# Patient Record
Sex: Male | Born: 1935 | Race: White | Hispanic: No | Smoking: Former smoker
Health system: Southern US, Community
[De-identification: ages and names within clinical notes are randomized; demographics above are authoritative.]

## PROBLEM LIST (undated history)

## (undated) DIAGNOSIS — J449 Chronic obstructive pulmonary disease, unspecified: Secondary | ICD-10-CM

## (undated) DIAGNOSIS — C189 Malignant neoplasm of colon, unspecified: Secondary | ICD-10-CM

## (undated) HISTORY — PX: OTHER SURGICAL HISTORY: SHX169

---

## 1998-02-15 ENCOUNTER — Encounter: Admission: RE | Admit: 1998-02-15 | Discharge: 1998-05-16 | Payer: Self-pay | Admitting: Orthopaedic Surgery

## 2004-04-25 ENCOUNTER — Ambulatory Visit (HOSPITAL_BASED_OUTPATIENT_CLINIC_OR_DEPARTMENT_OTHER): Admission: RE | Admit: 2004-04-25 | Discharge: 2004-04-25 | Payer: Self-pay | Admitting: Orthopedic Surgery

## 2004-04-25 ENCOUNTER — Ambulatory Visit (HOSPITAL_COMMUNITY): Admission: RE | Admit: 2004-04-25 | Discharge: 2004-04-25 | Payer: Self-pay | Admitting: Orthopedic Surgery

## 2006-10-18 ENCOUNTER — Inpatient Hospital Stay (HOSPITAL_COMMUNITY): Admission: EM | Admit: 2006-10-18 | Discharge: 2006-10-23 | Payer: Self-pay | Admitting: Emergency Medicine

## 2008-08-10 ENCOUNTER — Ambulatory Visit (HOSPITAL_COMMUNITY): Admission: RE | Admit: 2008-08-10 | Discharge: 2008-08-10 | Payer: Self-pay | Admitting: Ophthalmology

## 2008-08-31 ENCOUNTER — Ambulatory Visit (HOSPITAL_COMMUNITY): Admission: RE | Admit: 2008-08-31 | Discharge: 2008-08-31 | Payer: Self-pay | Admitting: Ophthalmology

## 2010-04-08 ENCOUNTER — Encounter: Admission: RE | Admit: 2010-04-08 | Discharge: 2010-04-08 | Payer: Self-pay | Admitting: Gastroenterology

## 2011-04-04 NOTE — Op Note (Signed)
NAME:  [name], [name]                          ACCOUNT NO.:  [id number]   MEDICAL RECORD NO.:  [id number]                   PATIENT TYPE:  AMB   LOCATION:  DSC                                  FACILITY:  [facility]   PHYSICIAN:  Katy Fitch. Naaman Plummer., M.D.          DATE OF BIRTH:  05-10-1936   DATE OF PROCEDURE:  04/25/2004  DATE OF DISCHARGE:                                 OPERATIVE REPORT   PREOPERATIVE DIAGNOSES:  Comminuted fracture of right thumb P1 shaft.   POSTOPERATIVE DIAGNOSES:  Comminuted fracture of right thumb P1 shaft.   OPERATION:  Open reduction and internal fixation of a comminuted right thumb  P1 fracture utilizing a 1.3 mm eight hole ASIF modular plate.   SURGEON:  Katy Fitch. Sypher, M.D.   ASSISTANT:  Jonni Sanger, P.A.   ANESTHESIA:  Axillary block supplemented by IV sedation.  Supervision  anesthesiologist is Bedelia Person, M.D.   INDICATIONS FOR PROCEDURE:  [name] is a 75 year old man who was  bailing hay earlier this week.  He accidentally crushed his right dominant  thumb sustaining a comminuted fracture.   He was seen at [facility] by Marjory Lies, M.D. who  obtained x-rays documenting a comminuted shaft fracture of the right thumb.  He was referred for a hand surgery consult.   Clinical examination revealed a swollen and angulated right thumb. There was  noted to be a comminuted fracture with a transverse component and a split of  the distal half of the phalanx.   We recommended open reduction and internal fixation with plate fixation to  facilitate early range of motion exercises.   After informed consent Mr. Daubert is brought to the operating room at this  time.   DESCRIPTION OF PROCEDURE:  Quaron Delacruz is brought to the operating room and  placed in supine position upon the operating table. Following placement of  an axillary block in the holding area, anesthesia was satisfactory in the  right upper extremity.   The arm  was prepped with Betadine __________ solution and sterilely draped.  A pneumatic tourniquet was applied to the proximal brachium.   Following exsanguination of the right arm with an esmarch bandage, the  arterial tourniquet was inflated to 220 mmHg.   The procedure commenced with a curvilinear incision exposing the extensor  mechanism over P1.  The subcutaneous tissues were carefully divided taking  care to identify the longitudinal veins and electrocauterize the transverse  veins.  The extensor tendon was split from the base of the proximal phalanx  to the IP joint distally. A periosteal elevator was used to gently elevate  the periosteum revealing the comminuted fracture site.   The fracture was reduced by a combination of longitudinal traction and  slight three point bending. This was reduced anatomically along the dorsal,  radial and ulnar cortices.   An eight hole titanium ASIF plate was selected and placed with  appropriate  length screws.  The C-arm fluoroscope was used after each screw placement to  determine screw depth and to assure maintenance with reduction.   There was some comminution of a small fragment that was slightly impacted.  In my judgment, this did not require open reduction from a volar approach. A  very satisfactory construct was achieved. Range of motion of the MP and IP  joint was full after placement of the complimented eight screws.   The wound was lavaged with sterile saline followed by repair of the  periosteum with a running suture of 4-0 Vicryl and repair of the extensor  mechanism with a running suture of 4-0 Mersilene. The skin was repaired with  intradermal 3-0 Prolene. A compression dressing applied with a thumb spica  splint.   There were no apparent complications.                                               Katy Fitch Naaman Plummer., M.D.    RVS/MEDQ  D:  04/25/2004  T:  04/26/2004  Job:  [id number]
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
## 2011-04-04 NOTE — Discharge Summary (Signed)
NAMEOSHAY, STRANAHAN NO.:  [id number]   MEDICAL RECORD NO.:  [id number]          PATIENT TYPE:  INP   LOCATION:  1610                         [facility]AC[facility]   PHYSICIAN:  Lonia Blood, M.D.      DATE OF BIRTH:  05-11-1936   DATE OF ADMISSION:  10/18/2006  DATE OF DISCHARGE:  10/23/2006                               DISCHARGE SUMMARY   PRIMARY CARE PHYSICIAN:  [facility].   DISCHARGE DIAGNOSES:  1. Acute urinary retention, most likely due to prostate enlargement.  2. Severe ileus.  3. Depression/anxiety.  4. Insomnia.  5. Dyslipidemia.   DISCHARGE MEDICATIONS:  1. Flomax 0.4 mg daily.  2. Lipitor 10 mg daily.  3. Remeron 30 mg daily.  4. Xanax 0.5 to 1 mg p.o. b.i.d. and 1-2 mg at night.   DISPOSITION:  Patient is being discharged home.  His symptoms have  subsided.  His ileus has resolved.  He will follow up with Dr. Laverle Patter,  urologist, in 1-2 weeks for further outpatient care.   Procedures performed this admission include abdominal x-ray on December  2nd that showed no acute abdominal abnormalities, hyperinflation  consistent with COPD/asthma, but no acute cardiopulmonary disease.   Another x-ray on December 3 shows no change in the mild diffuse ileus.   Another x-ray on December 4th showed findings most consistent with a  nonobstructive ileus.   Abdominal x-ray on December 5th showed improvement in ileus.   CONSULTATIONS:  Urology, Dr. Patsi Sears.  Later, Dr. Heloise Purpura.   BRIEF HISTORY AND PHYSICAL:  Please refer to extensive history and  physical by Dr. Lonia Blood.  In short, however, this is a 75-  year-old gentleman with known history of prostatic hypertrophy  presenting with acute urinary and bowel retention.  Patient was admitted  for workup of his urinary retention and further management.   HOSPITAL COURSE:  1. Acute urinary retention:  Patient had apparently discontinued      Flomax as an outpatient.  He  was restarted on Flomax while in the      hospital, and urology was consulted.  They placed, patient was on      Foley catheter for the past two days. Subsequently, a voiding trial      was performed after discontinuing his Foley catheter.  Patient was      able to void without difficulty since then.  Per urology,      therefore, he will be followed up as an outpatient.  Part of the      workup was ruling out prostate cancer.  His prostate specific      antigen, PSA, was elevated mildly at 4.23.  This was, however,      agreed to be followed as an outpatient by Dr. Laverle Patter.   1. Ileus:  Patient apparently had an abdominal ileus.  There is      reported prior history of surgery, but it was nonobstructive.  We      followed him serially with abdominal x-rays while keeping him on      bowel rest.  His ileus has since resolved.  He has been having      bowel movements, and patient is eating with advancement of diet to      regular today.   1. Tobacco abuse:  He was counseled and given a nicotine patch while      in the hospital.   1. Occasional confusion:  Patient has had a few episodes of confusion      at night, but mainly, he has been insomnic.  That has been promptly      corrected.  Otherwise, all other problems are stable, and the      patient is ready for discharge home.  He is going to continue his      antianxiety and antidepressant medications.      Lonia Blood, M.D.  Electronically Signed     LG/MEDQ  D:  10/23/2006  T:  10/23/2006  Job:  [id number]

## 2011-08-18 LAB — BASIC METABOLIC PANEL
CO2: 27
Chloride: 105
GFR calc Af Amer: >60
Potassium: 4

## 2011-08-18 LAB — HEMOGLOBIN AND HEMATOCRIT, BLOOD: HCT: 42.3

## 2013-12-27 ENCOUNTER — Emergency Department (HOSPITAL_COMMUNITY): Payer: Medicare Other

## 2013-12-27 ENCOUNTER — Encounter (HOSPITAL_COMMUNITY): Payer: Self-pay | Admitting: Emergency Medicine

## 2013-12-27 ENCOUNTER — Inpatient Hospital Stay (HOSPITAL_COMMUNITY)
Admission: EM | Admit: 2013-12-27 | Discharge: 2013-12-29 | DRG: 190 | Discharge status: Against Medical Advice | Payer: Medicare Other | Attending: Internal Medicine | Admitting: Internal Medicine

## 2013-12-27 DIAGNOSIS — E874 Mixed disorder of acid-base balance: Secondary | ICD-10-CM | POA: Diagnosis present

## 2013-12-27 DIAGNOSIS — Z85038 Personal history of other malignant neoplasm of large intestine: Secondary | ICD-10-CM

## 2013-12-27 DIAGNOSIS — E43 Unspecified severe protein-calorie malnutrition: Secondary | ICD-10-CM | POA: Diagnosis present

## 2013-12-27 DIAGNOSIS — J441 Chronic obstructive pulmonary disease with (acute) exacerbation: Principal | ICD-10-CM | POA: Diagnosis present

## 2013-12-27 DIAGNOSIS — C189 Malignant neoplasm of colon, unspecified: Secondary | ICD-10-CM | POA: Insufficient documentation

## 2013-12-27 DIAGNOSIS — J9600 Acute respiratory failure, unspecified whether with hypoxia or hypercapnia: Secondary | ICD-10-CM | POA: Diagnosis present

## 2013-12-27 DIAGNOSIS — K5900 Constipation, unspecified: Secondary | ICD-10-CM | POA: Diagnosis present

## 2013-12-27 DIAGNOSIS — Z681 Body mass index (BMI) 19 or less, adult: Secondary | ICD-10-CM

## 2013-12-27 DIAGNOSIS — R000 Tachycardia, unspecified: Secondary | ICD-10-CM | POA: Diagnosis not present

## 2013-12-27 DIAGNOSIS — Z9889 Other specified postprocedural states: Secondary | ICD-10-CM

## 2013-12-27 DIAGNOSIS — Z79899 Other long term (current) drug therapy: Secondary | ICD-10-CM

## 2013-12-27 DIAGNOSIS — R1310 Dysphagia, unspecified: Secondary | ICD-10-CM

## 2013-12-27 DIAGNOSIS — R490 Dysphonia: Secondary | ICD-10-CM | POA: Diagnosis present

## 2013-12-27 DIAGNOSIS — W3400XA Accidental discharge from unspecified firearms or gun, initial encounter: Secondary | ICD-10-CM

## 2013-12-27 DIAGNOSIS — Z87891 Personal history of nicotine dependence: Secondary | ICD-10-CM

## 2013-12-27 HISTORY — DX: Malignant neoplasm of colon, unspecified: C18.9

## 2013-12-27 HISTORY — DX: Chronic obstructive pulmonary disease, unspecified: J44.9

## 2013-12-27 LAB — CBC
HEMATOCRIT: 42.1 % (ref 39.0–52.0)
HEMOGLOBIN: 13.7 g/dL (ref 13.0–17.0)
MCH: 29.7 pg (ref 26.0–34.0)
MCHC: 32.5 g/dL (ref 30.0–36.0)
MCV: 91.3 fL (ref 78.0–100.0)
Platelets: 195 10*3/uL (ref 150–400)
RBC: 4.61 MIL/uL (ref 4.22–5.81)
RDW: 14 % (ref 11.5–15.5)
WBC: 5.7 10*3/uL (ref 4.0–10.5)

## 2013-12-27 LAB — BLOOD GAS, ARTERIAL
ACID-BASE EXCESS: 7.6 mmol/L — AB (ref 0.0–2.0)
BICARBONATE: 33.5 meq/L — AB (ref 20.0–24.0)
Drawn by: 347621
FIO2: 0.21 %
O2 SAT: 84.4 %
PATIENT TEMPERATURE: 98.6
PH ART: 7.407 (ref 7.350–7.450)
TCO2: 29.9 mmol/L (ref 0–100)
pCO2 arterial: 54.4 mmHg — ABNORMAL HIGH (ref 35.0–45.0)
pO2, Arterial: 46.3 mmHg — ABNORMAL LOW (ref 80.0–100.0)

## 2013-12-27 LAB — EXPECTORATED SPUTUM ASSESSMENT W REFEX TO RESP CULTURE

## 2013-12-27 LAB — BASIC METABOLIC PANEL
BUN: 16 mg/dL (ref 6–23)
CALCIUM: 8.7 mg/dL (ref 8.4–10.5)
CO2: 28 mEq/L (ref 19–32)
CREATININE: 0.75 mg/dL (ref 0.50–1.35)
Chloride: 99 mEq/L (ref 96–112)
GFR calc Af Amer: >90 mL/min (ref >90)
GFR, EST NON AFRICAN AMERICAN: 88 mL/min — AB (ref >90)
Glucose, Bld: 111 mg/dL — ABNORMAL HIGH (ref 70–99)
Potassium: 3.7 mEq/L (ref 3.7–5.3)
SODIUM: 140 meq/L (ref 137–147)

## 2013-12-27 LAB — EXPECTORATED SPUTUM ASSESSMENT W GRAM STAIN, RFLX TO RESP C

## 2013-12-27 LAB — TROPONIN I: Troponin I: <0.3 ng/mL (ref <0.30)

## 2013-12-27 LAB — INFLUENZA PANEL BY PCR (TYPE A & B)
H1N1FLUPCR: NOT DETECTED
INFLBPCR: NEGATIVE
Influenza A By PCR: NEGATIVE

## 2013-12-27 LAB — MRSA PCR SCREENING: MRSA by PCR: NEGATIVE

## 2013-12-27 MED ORDER — OSELTAMIVIR PHOSPHATE 75 MG PO CAPS
75.0000 mg | ORAL_CAPSULE | Freq: Two times a day (BID) | ORAL | Status: DC
  Filled 2013-12-27 (×2): qty 1

## 2013-12-27 MED ORDER — BIOTENE DRY MOUTH MT LIQD
15.0000 mL | Freq: Two times a day (BID) | OROMUCOSAL | Status: DC
Start: 2013-12-27 — End: 2013-12-29
  Administered 2013-12-27 – 2013-12-28 (×3): 15 mL via OROMUCOSAL

## 2013-12-27 MED ORDER — ALBUTEROL SULFATE (2.5 MG/3ML) 0.083% IN NEBU
5.0000 mg | INHALATION_SOLUTION | Freq: Once | RESPIRATORY_TRACT | Status: AC
  Administered 2013-12-27: 5 mg via RESPIRATORY_TRACT
  Filled 2013-12-27: qty 6

## 2013-12-27 MED ORDER — TAMSULOSIN HCL 0.4 MG PO CAPS
0.8000 mg | ORAL_CAPSULE | Freq: Every day | ORAL | Status: DC
Start: 2013-12-27 — End: 2013-12-29
  Administered 2013-12-27 – 2013-12-28 (×2): 0.8 mg via ORAL
  Filled 2013-12-27 (×3): qty 2

## 2013-12-27 MED ORDER — ONDANSETRON HCL 4 MG/2ML IJ SOLN
4.0000 mg | Freq: Four times a day (QID) | INTRAMUSCULAR | Status: DC | PRN
  Administered 2013-12-29: 4 mg via INTRAVENOUS
  Filled 2013-12-27: qty 2

## 2013-12-27 MED ORDER — FLUTICASONE PROPIONATE 50 MCG/ACT NA SUSP
2.0000 | Freq: Every day | NASAL | Status: DC
  Administered 2013-12-29: 2 via NASAL
  Filled 2013-12-27 (×2): qty 16

## 2013-12-27 MED ORDER — MIRTAZAPINE 15 MG PO TABS
15.0000 mg | ORAL_TABLET | Freq: Every day | ORAL | Status: DC
  Administered 2013-12-27 – 2013-12-28 (×2): 15 mg via ORAL
  Filled 2013-12-27 (×3): qty 1

## 2013-12-27 MED ORDER — SODIUM CHLORIDE 0.9 % IV SOLN
INTRAVENOUS | Status: AC
  Administered 2013-12-27 (×2): via INTRAVENOUS

## 2013-12-27 MED ORDER — ALBUTEROL SULFATE (2.5 MG/3ML) 0.083% IN NEBU: 2.5000 mg | INHALATION_SOLUTION | RESPIRATORY_TRACT | Status: DC

## 2013-12-27 MED ORDER — ONDANSETRON HCL 4 MG PO TABS: 4.0000 mg | ORAL_TABLET | Freq: Four times a day (QID) | ORAL | Status: DC | PRN

## 2013-12-27 MED ORDER — LACTULOSE 10 GM/15ML PO SOLN
10.0000 g | Freq: Two times a day (BID) | ORAL | Status: DC | PRN
  Administered 2013-12-28: 10 g via ORAL
  Filled 2013-12-27 (×2): qty 15

## 2013-12-27 MED ORDER — METHYLPREDNISOLONE SODIUM SUCC 125 MG IJ SOLR
125.0000 mg | Freq: Once | INTRAMUSCULAR | Status: AC
  Administered 2013-12-27: 125 mg via INTRAVENOUS
  Filled 2013-12-27: qty 2

## 2013-12-27 MED ORDER — DEXTROSE 5 % IV SOLN
500.0000 mg | INTRAVENOUS | Status: DC
  Administered 2013-12-28: 500 mg via INTRAVENOUS
  Filled 2013-12-27: qty 500

## 2013-12-27 MED ORDER — MESALAMINE 1000 MG RE SUPP
1000.0000 mg | Freq: Every day | RECTAL | Status: DC
  Administered 2013-12-27 – 2013-12-28 (×2): 1000 mg via RECTAL
  Filled 2013-12-27 (×3): qty 1

## 2013-12-27 MED ORDER — SODIUM CHLORIDE 0.9 % IV SOLN: INTRAVENOUS | Status: DC

## 2013-12-27 MED ORDER — MESALAMINE 1000 MG RE SUPP
1000.0000 mg | Freq: Every day | RECTAL | Status: DC
  Filled 2013-12-27: qty 1

## 2013-12-27 MED ORDER — DEXTROSE 5 % IV SOLN
1.0000 g | INTRAVENOUS | Status: DC
  Administered 2013-12-28: 1 g via INTRAVENOUS
  Filled 2013-12-27: qty 10

## 2013-12-27 MED ORDER — IPRATROPIUM-ALBUTEROL 0.5-2.5 (3) MG/3ML IN SOLN
3.0000 mL | RESPIRATORY_TRACT | Status: DC
  Administered 2013-12-27 (×2): 3 mL via RESPIRATORY_TRACT
  Filled 2013-12-27 (×2): qty 3

## 2013-12-27 MED ORDER — IPRATROPIUM-ALBUTEROL 0.5-2.5 (3) MG/3ML IN SOLN
3.0000 mL | Freq: Four times a day (QID) | RESPIRATORY_TRACT | Status: DC
  Administered 2013-12-27 – 2013-12-29 (×6): 3 mL via RESPIRATORY_TRACT
  Filled 2013-12-27 (×6): qty 3

## 2013-12-27 MED ORDER — ALPRAZOLAM 0.5 MG PO TABS
0.5000 mg | ORAL_TABLET | Freq: Two times a day (BID) | ORAL | Status: DC | PRN
  Administered 2013-12-27 – 2013-12-29 (×3): 0.5 mg via ORAL
  Filled 2013-12-27 (×4): qty 1

## 2013-12-27 MED ORDER — IPRATROPIUM BROMIDE 0.02 % IN SOLN: 0.5000 mg | RESPIRATORY_TRACT | Status: DC

## 2013-12-27 MED ORDER — LUBIPROSTONE 24 MCG PO CAPS
24.0000 ug | ORAL_CAPSULE | Freq: Two times a day (BID) | ORAL | Status: DC
  Administered 2013-12-28 – 2013-12-29 (×2): 24 ug via ORAL
  Filled 2013-12-27 (×6): qty 1

## 2013-12-27 MED ORDER — ATORVASTATIN CALCIUM 20 MG PO TABS
20.0000 mg | ORAL_TABLET | Freq: Every day | ORAL | Status: DC
  Administered 2013-12-28 – 2013-12-29 (×2): 20 mg via ORAL
  Filled 2013-12-27 (×3): qty 1

## 2013-12-27 MED ORDER — HEPARIN SODIUM (PORCINE) 5000 UNIT/ML IJ SOLN
5000.0000 [IU] | Freq: Three times a day (TID) | INTRAMUSCULAR | Status: DC
  Administered 2013-12-27 – 2013-12-29 (×6): 5000 [IU] via SUBCUTANEOUS
  Filled 2013-12-27 (×10): qty 1

## 2013-12-27 MED ORDER — LACTULOSE 10 GM/15ML PO SOLN
10.0000 g | Freq: Two times a day (BID) | ORAL | Status: DC | PRN
  Filled 2013-12-27: qty 15

## 2013-12-27 MED ORDER — SODIUM CHLORIDE 0.9 % IJ SOLN
3.0000 mL | Freq: Two times a day (BID) | INTRAMUSCULAR | Status: DC
  Administered 2013-12-27 – 2013-12-29 (×2): 3 mL via INTRAVENOUS

## 2013-12-27 MED ORDER — ALPRAZOLAM 1 MG PO TABS
1.0000 mg | ORAL_TABLET | Freq: Once | ORAL | Status: AC
  Administered 2013-12-27: 1 mg via ORAL
  Filled 2013-12-27: qty 1

## 2013-12-27 MED ORDER — ALBUTEROL SULFATE (2.5 MG/3ML) 0.083% IN NEBU: 2.5000 mg | INHALATION_SOLUTION | RESPIRATORY_TRACT | Status: DC | PRN

## 2013-12-27 MED ORDER — ALBUTEROL (5 MG/ML) CONTINUOUS INHALATION SOLN
10.0000 mg/h | INHALATION_SOLUTION | RESPIRATORY_TRACT | Status: DC
  Administered 2013-12-27: 10 mg/h via RESPIRATORY_TRACT
  Filled 2013-12-27: qty 20

## 2013-12-27 MED ORDER — METHYLPREDNISOLONE SODIUM SUCC 40 MG IJ SOLR
40.0000 mg | Freq: Four times a day (QID) | INTRAMUSCULAR | Status: DC
  Administered 2013-12-27 – 2013-12-29 (×8): 40 mg via INTRAVENOUS
  Filled 2013-12-27 (×13): qty 1

## 2013-12-27 MED ORDER — GUAIFENESIN ER 600 MG PO TB12
600.0000 mg | ORAL_TABLET | Freq: Two times a day (BID) | ORAL | Status: DC
  Administered 2013-12-27 – 2013-12-29 (×4): 600 mg via ORAL
  Filled 2013-12-27 (×6): qty 1

## 2013-12-27 NOTE — ED Notes (Signed)
Respiratory at bedside.
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
## 2013-12-27 NOTE — ED Notes (Addendum)
Pt from home via [facility] c/o shortness of breath starting today. Wheezes in all fields and diminished. Hoarness in voice he reports has been there 2 years. HX of COPD and Colon CA. Pt gets extremely short of breath on exertion and uses accessory muscles. Pt has a RX for Prednisone but has not filled it and he ran out of his neb treatments today. Pt is a poor historian.
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
## 2013-12-27 NOTE — ED Notes (Signed)
Pt's O2 saturation 84-86% on room air and while at rest.

## 2013-12-27 NOTE — ED Notes (Signed)
Bed: TM21 Expected date:  Expected time:  Means of arrival:  Comments: EMS/SOB/colon cancer pt

## 2013-12-27 NOTE — ED Notes (Signed)
Pt's O2 saturation remained at 86% on room air while at rest.
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
## 2013-12-28 DIAGNOSIS — E43 Unspecified severe protein-calorie malnutrition: Secondary | ICD-10-CM | POA: Insufficient documentation

## 2013-12-28 DIAGNOSIS — R1310 Dysphagia, unspecified: Secondary | ICD-10-CM

## 2013-12-28 LAB — GLUCOSE, CAPILLARY: Glucose-Capillary: 181 mg/dL — ABNORMAL HIGH (ref 70–99)

## 2013-12-28 MED ORDER — LEVOFLOXACIN 500 MG PO TABS
500.0000 mg | ORAL_TABLET | Freq: Every day | ORAL | Status: DC
  Administered 2013-12-28 – 2013-12-29 (×2): 500 mg via ORAL
  Filled 2013-12-28 (×2): qty 1

## 2013-12-28 MED ORDER — SODIUM CHLORIDE 0.9 % IV SOLN: INTRAVENOUS | Status: DC

## 2013-12-28 MED ORDER — MOMETASONE FURO-FORMOTEROL FUM 100-5 MCG/ACT IN AERO
2.0000 | INHALATION_SPRAY | Freq: Two times a day (BID) | RESPIRATORY_TRACT | Status: DC
  Administered 2013-12-28 – 2013-12-29 (×2): 2 via RESPIRATORY_TRACT
  Filled 2013-12-28: qty 8.8

## 2013-12-28 NOTE — Progress Notes (Signed)
SLP Cancellation Note  Patient Details Name: [name] MRN: [id number] DOB: [birth year]   Cancelled treatment:       Reason Eval/Treat Not Completed: Other (comment) (pt lethargy, RN reports pt had received remeron earlier and has been lethargic, she does report tolerance of po last evening)   Luanna Salk, [facility] SLP [phone]
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
## 2013-12-29 LAB — BASIC METABOLIC PANEL
BUN: 29 mg/dL — AB (ref 6–23)
CHLORIDE: 100 meq/L (ref 96–112)
CO2: 32 meq/L (ref 19–32)
CREATININE: 0.78 mg/dL (ref 0.50–1.35)
Calcium: 9 mg/dL (ref 8.4–10.5)
GFR calc Af Amer: >90 mL/min (ref >90)
GFR calc non Af Amer: 86 mL/min — ABNORMAL LOW (ref >90)
Glucose, Bld: 159 mg/dL — ABNORMAL HIGH (ref 70–99)
Potassium: 3.8 mEq/L (ref 3.7–5.3)
Sodium: 142 mEq/L (ref 137–147)

## 2013-12-29 LAB — CBC
HCT: 42 % (ref 39.0–52.0)
HEMOGLOBIN: 13.4 g/dL (ref 13.0–17.0)
MCH: 29.3 pg (ref 26.0–34.0)
MCHC: 31.9 g/dL (ref 30.0–36.0)
MCV: 91.7 fL (ref 78.0–100.0)
Platelets: 213 10*3/uL (ref 150–400)
RBC: 4.58 MIL/uL (ref 4.22–5.81)
RDW: 14.6 % (ref 11.5–15.5)
WBC: 16.3 10*3/uL — ABNORMAL HIGH (ref 4.0–10.5)

## 2013-12-29 MED ORDER — PREDNISONE 20 MG PO TABS: ORAL_TABLET | ORAL | Status: DC

## 2013-12-29 MED ORDER — ALBUTEROL SULFATE (2.5 MG/3ML) 0.083% IN NEBU: 2.5000 mg | INHALATION_SOLUTION | Freq: Four times a day (QID) | RESPIRATORY_TRACT | Status: AC | PRN

## 2013-12-29 MED ORDER — GUAIFENESIN ER 600 MG PO TB12: 600.0000 mg | ORAL_TABLET | Freq: Two times a day (BID) | ORAL | Status: DC

## 2013-12-29 MED ORDER — IPRATROPIUM-ALBUTEROL 0.5-2.5 (3) MG/3ML IN SOLN
3.0000 mL | Freq: Four times a day (QID) | RESPIRATORY_TRACT | Status: DC
  Administered 2013-12-29 (×2): 3 mL via RESPIRATORY_TRACT
  Filled 2013-12-29 (×3): qty 3

## 2013-12-29 MED ORDER — LEVOFLOXACIN 500 MG PO TABS: 500.0000 mg | ORAL_TABLET | Freq: Every day | ORAL | Status: DC

## 2013-12-29 NOTE — Care Management Note (Signed)
    Page 1 of 1   12/29/2013     3:00:32 PM   CARE MANAGEMENT NOTE 12/29/2013  Patient:  [name], [name]   Account Number:  [id number]  Date Initiated:  12/28/2013  Documentation initiated by:  [facility]  Subjective/Objective Assessment:   78 year old male admitted with COPD exacerbation.     Action/Plan:   From home.   Anticipated DC Date:  12/31/2013   Anticipated DC Plan:  [place]  CM consult      Choice offered to / List presented to:             Status of service:  Completed, signed off Medicare Important Message given?  NA - LOS <3 / Initial given by admissions (If response is "NO", the following Medicare IM given date fields will be blank) Date Medicare IM given:   Date Additional Medicare IM given:    Discharge Disposition:  [place]  Per UR Regulation:  Reviewed for med. necessity/level of care/duration of stay  If discussed at [place] of Stay Meetings, dates discussed:    Comments:

## 2013-12-29 NOTE — Progress Notes (Signed)
Assessment unchanged. Sats in upper 90's following neb tx. No complaints voiced. Pt still wants to go home. Dr. Maryland Pink called room and spoke with pt. Encouraged pt to stay per Dr. Lyman Speller recommendations yet pt adamant to go home. Pt signed AMA papers. Dr. Maryland Pink notified and okayed for pt to have scripts for home with encouragement to follow up with PCP, Dr. Redmond Pulling, as soon as possible. Pt discharged via wc to front entrance accompanied by NT and son.

## 2013-12-29 NOTE — Evaluation (Signed)
Physical Therapy Evaluation Patient Details Name: [name] MRN: [id number] DOB: [birth year] Today's Date: 12/29/2013 Time: 4825-0037 PT Time Calculation (min): 24 min  PT Assessment / Plan / Recommendation History of Present Illness  [name] is a 78 y.o. male with Past medical history of colon cancer, urinary retention, former smoker and COPD admitted with shortness of breath  Clinical Impression  Pt appears at baseline level of functioning, no further acute PT needs identified at this time.    PT Assessment  Patent does not need any further PT services    Follow Up Recommendations  No PT follow up    Does the patient have the potential to tolerate intense rehabilitation      Barriers to Discharge        Equipment Recommendations  None recommended by PT    Recommendations for Other Services     Frequency      Precautions / Restrictions Precautions Precautions: Fall Restrictions Weight Bearing Restrictions: No   Pertinent Vitals/Pain No c/o pain      Mobility  Bed Mobility Overal bed mobility: Modified Independent Transfers Overall transfer level: Modified independent Ambulation/Gait Ambulation/Gait assistance: Modified independent (Device/Increase time) Ambulation Distance (Feet): 150 Feet Assistive device: None Gait velocity interpretation: Below normal speed for age/gender General Gait Details: pt requires frequent rest breaks standing to catch breath, spO2 91% on room air during activity    Exercises     PT Diagnosis:    PT Problem List:   PT Treatment Interventions:       PT Goals(Current goals can be found in the care plan section) Acute Rehab PT Goals PT Goal Formulation: No goals set, d/c therapy  Visit Information  Last PT Received On: 12/29/13 Assistance Needed: +1 History of Present Illness: [name] is a 78 y.o. male with Past medical history of colon cancer, urinary retention, former smoker and COPD admitted with  shortness of breath       Prior Functioning  Home Living Family/patient expects to be discharged to:: Private residence Living Arrangements: Alone Available Help at Discharge: Family;Available PRN/intermittently Type of Home: House Home Access: Level entry Home Layout: One level Home Equipment: None Prior Function Level of Independence: Independent Communication Communication:  (voice is hoarse, increased SOB with talking)    Cognition  Cognition Arousal/Alertness: Awake/alert Behavior During Therapy: WFL for tasks assessed/performed Overall Cognitive Status: Within Functional Limits for tasks assessed    Extremity/Trunk Assessment Lower Extremity Assessment Lower Extremity Assessment: Generalized weakness Cervical / Trunk Assessment Cervical / Trunk Assessment: Normal   Balance Balance Overall balance assessment: Modified Independent General Comments General comments (skin integrity, edema, etc.): pt able to stand and perform grooming tasks without UE support without LOB  End of Session PT - End of Session Activity Tolerance: Patient tolerated treatment well Patient left: in bed;with call bell/phone within reach;with bed alarm set Nurse Communication: Mobility status  GP     Lucianna Ostlund 12/29/2013, 9:49 AM

## 2013-12-29 NOTE — Progress Notes (Signed)
Dr. Maryland Pink aware via phone pt's HR remains elevated as well as BP. Current sat 97% on RA. BP 138/119 (dinamapp) and 136/92 (manually) with current HR 117. Pt asymptomatic with no complaints voiced. Stated "I just want to go home." Resting in recliner with feet elevated. MD ask nurse to call RT for breathing treatment. RT notified per MD request.

## 2013-12-29 NOTE — Progress Notes (Signed)
Dr. Maryland Pink aware via phone RT in to see pt. Pt up getting ready. O2 sat 85-86% on RA while up in room with fluctuating HR 120's-130's with occational spikes. Tachypnea noted while up. Sitting in chair at present per encouragement of RT and nurse. Tachypnea improving and sats 91-93% on RA yet HR remains high. See new orders enter by MD.

## 2013-12-29 NOTE — Progress Notes (Addendum)
Called by Rn that his saturations were 86% with activity. Patient did not complain of feeling more short of breath. At rest the sats came upto to 92%. His HR was also high at 130. Asked Rn to keep checking HR and allow it to come down.  This was surprising given sats were ok when he was ambulated by PT/OT.  In view of this will hold discharge for now. Will continue one more day of inpatient treatment. Will also order home O2.  [name] 1:03 PM   Called by RN that patient wanted to go home. Patient continued to be tachycardic. His saturations had improved. I spoke to the patient and I recommended that he stay for at least one more day so that we can continue with his inpatient treatment. However, patient was adamant that he wanted to go home. I tried to tell him that it was more important that he stay in the hospital to take care of his health. However, patient refused my recommendations. I explained to him that if he goes home and remains short of breath with low oxygen levels, he could get worse, get more short of breath, could pass out and could even die. Patient understood these risks. And he left AGAINST MEDICAL ADVICE.  [name] 5:12 PM

## 2013-12-29 NOTE — Evaluation (Signed)
Occupational Therapy Evaluation Patient Details Name: [name] MRN: [id number] DOB: [birth year] Today's Date: 12/29/2013 Time: 9407-6808 OT Time Calculation (min): 12 min  OT Assessment / Plan / Recommendation History of present illness [name] is a 78 y.o. male with Past medical history of colon cancer, urinary retention, former smoker and COPD admitted with shortness of breath   Clinical Impression   Pt I with Simple ADL activity    OT Assessment  Patient does not need any further OT services    Follow Up Recommendations  No OT follow up       Equipment Recommendations  None recommended by OT          Precautions / Restrictions Precautions Precautions: Fall Restrictions Weight Bearing Restrictions: No       ADL  Transfers/Ambulation Related to ADLs: Pt is I with ADL activity. Family helps pt as he needs.  Pt hard to understand with his voice but it I with simple ADL activity. no further OT needed.        Visit Information  Last OT Received On: 12/29/13 Assistance Needed: +1 History of Present Illness: [name] is a 78 y.o. male with Past medical history of colon cancer, urinary retention, former smoker and COPD admitted with shortness of breath       Prior [place] expects to be discharged to:: Private residence Living Arrangements: Alone Available Help at Discharge: Family;Available PRN/intermittently Type of Home: House Home Access: Level entry Home Layout: One level Home Equipment: None Prior Function Level of Independence: Independent Communication Communication:  (voice is hoarse, increased SOB with talking)         Vision/Perception Vision - History Baseline Vision: Wears glasses for distance only   Cognition  Cognition Arousal/Alertness: Awake/alert Behavior During Therapy: WFL for tasks assessed/performed Overall Cognitive Status: Within Functional Limits for tasks assessed     Extremity/Trunk Assessment Upper Extremity Assessment Upper Extremity Assessment: Generalized weakness Lower Extremity Assessment Lower Extremity Assessment: Generalized weakness Cervical / Trunk Assessment Cervical / Trunk Assessment: Normal     Mobility Bed Mobility Overal bed mobility: Modified Independent Transfers Overall transfer level: Modified independent        Balance Balance Overall balance assessment: Modified Independent General Comments General comments (skin integrity, edema, etc.): pt able to stand and perform grooming tasks without UE support without LOB   End of Session OT - End of Session Activity Tolerance: Patient tolerated treatment well Patient left: in bed Nurse Communication: Mobility status  GO     Betsy Pries 12/29/2013, 11:52 AM
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
## 2013-12-30 LAB — CULTURE, RESPIRATORY W GRAM STAIN

## 2013-12-30 LAB — CULTURE, RESPIRATORY: Culture: NORMAL

## 2014-01-14 ENCOUNTER — Inpatient Hospital Stay (HOSPITAL_COMMUNITY): Payer: Medicare Other

## 2014-01-14 ENCOUNTER — Inpatient Hospital Stay (HOSPITAL_COMMUNITY)
Admission: EM | Admit: 2014-01-14 | Discharge: 2014-02-15 | DRG: 208 | Disposition: E | Payer: Medicare Other | Attending: Pulmonary Disease | Admitting: Pulmonary Disease

## 2014-01-14 ENCOUNTER — Emergency Department (HOSPITAL_COMMUNITY): Payer: Medicare Other

## 2014-01-14 DIAGNOSIS — R64 Cachexia: Secondary | ICD-10-CM | POA: Diagnosis present

## 2014-01-14 DIAGNOSIS — J9600 Acute respiratory failure, unspecified whether with hypoxia or hypercapnia: Principal | ICD-10-CM | POA: Diagnosis present

## 2014-01-14 DIAGNOSIS — I10 Essential (primary) hypertension: Secondary | ICD-10-CM | POA: Diagnosis present

## 2014-01-14 DIAGNOSIS — J189 Pneumonia, unspecified organism: Secondary | ICD-10-CM | POA: Diagnosis present

## 2014-01-14 DIAGNOSIS — Z87891 Personal history of nicotine dependence: Secondary | ICD-10-CM

## 2014-01-14 DIAGNOSIS — Z79899 Other long term (current) drug therapy: Secondary | ICD-10-CM

## 2014-01-14 DIAGNOSIS — Z681 Body mass index (BMI) 19 or less, adult: Secondary | ICD-10-CM

## 2014-01-14 DIAGNOSIS — J398 Other specified diseases of upper respiratory tract: Secondary | ICD-10-CM

## 2014-01-14 DIAGNOSIS — Z66 Do not resuscitate: Secondary | ICD-10-CM | POA: Diagnosis present

## 2014-01-14 DIAGNOSIS — J441 Chronic obstructive pulmonary disease with (acute) exacerbation: Secondary | ICD-10-CM | POA: Diagnosis present

## 2014-01-14 DIAGNOSIS — E43 Unspecified severe protein-calorie malnutrition: Secondary | ICD-10-CM | POA: Diagnosis present

## 2014-01-14 DIAGNOSIS — R222 Localized swelling, mass and lump, trunk: Secondary | ICD-10-CM | POA: Diagnosis present

## 2014-01-14 DIAGNOSIS — C189 Malignant neoplasm of colon, unspecified: Secondary | ICD-10-CM

## 2014-01-14 DIAGNOSIS — Z515 Encounter for palliative care: Secondary | ICD-10-CM

## 2014-01-14 LAB — CBC WITH DIFFERENTIAL/PLATELET
Basophils Absolute: 0 10*3/uL (ref 0.0–0.1)
Basophils Relative: 0 % (ref 0–1)
Eosinophils Absolute: 0 10*3/uL (ref 0.0–0.7)
Eosinophils Relative: 0 % (ref 0–5)
HCT: 41.2 % (ref 39.0–52.0)
HEMOGLOBIN: 13.3 g/dL (ref 13.0–17.0)
LYMPHS ABS: 1.2 10*3/uL (ref 0.7–4.0)
Lymphocytes Relative: 7 % — ABNORMAL LOW (ref 12–46)
MCH: 30.2 pg (ref 26.0–34.0)
MCHC: 32.3 g/dL (ref 30.0–36.0)
MCV: 93.4 fL (ref 78.0–100.0)
MONO ABS: 0.5 10*3/uL (ref 0.1–1.0)
MONOS PCT: 3 % (ref 3–12)
NEUTROS ABS: 14.1 10*3/uL — AB (ref 1.7–7.7)
NEUTROS PCT: 89 % — AB (ref 43–77)
Platelets: 195 10*3/uL (ref 150–400)
RBC: 4.41 MIL/uL (ref 4.22–5.81)
RDW: 15.4 % (ref 11.5–15.5)
WBC: 15.8 10*3/uL — ABNORMAL HIGH (ref 4.0–10.5)

## 2014-01-14 LAB — BASIC METABOLIC PANEL
BUN: 30 mg/dL — ABNORMAL HIGH (ref 6–23)
BUN: 30 mg/dL — ABNORMAL HIGH (ref 6–23)
CALCIUM: 8 mg/dL — AB (ref 8.4–10.5)
CHLORIDE: 104 meq/L (ref 96–112)
CO2: 31 mEq/L (ref 19–32)
CO2: 28 mEq/L (ref 19–32)
CREATININE: 0.83 mg/dL (ref 0.50–1.35)
Calcium: 8.3 mg/dL — ABNORMAL LOW (ref 8.4–10.5)
Chloride: 106 mEq/L (ref 96–112)
Creatinine, Ser: 1.06 mg/dL (ref 0.50–1.35)
GFR calc Af Amer: >90 mL/min (ref >90)
GFR calc non Af Amer: 67 mL/min — ABNORMAL LOW (ref >90)
GFR calc non Af Amer: 84 mL/min — ABNORMAL LOW (ref >90)
GFR, EST AFRICAN AMERICAN: 77 mL/min — AB (ref >90)
GLUCOSE: 265 mg/dL — AB (ref 70–99)
Glucose, Bld: 158 mg/dL — ABNORMAL HIGH (ref 70–99)
POTASSIUM: 4.6 meq/L (ref 3.7–5.3)
Potassium: 4.1 mEq/L (ref 3.7–5.3)
Sodium: 144 mEq/L (ref 137–147)
Sodium: 146 mEq/L (ref 137–147)

## 2014-01-14 LAB — URINE MICROSCOPIC-ADD ON

## 2014-01-14 LAB — HEPATIC FUNCTION PANEL
ALK PHOS: 55 U/L (ref 39–117)
ALT: 19 U/L (ref 0–53)
AST: 20 U/L (ref 0–37)
Albumin: 3.3 g/dL — ABNORMAL LOW (ref 3.5–5.2)
BILIRUBIN TOTAL: 0.4 mg/dL (ref 0.3–1.2)
Bilirubin, Direct: <0.2 mg/dL (ref 0.0–0.3)
TOTAL PROTEIN: 6.1 g/dL (ref 6.0–8.3)

## 2014-01-14 LAB — GLUCOSE, CAPILLARY
GLUCOSE-CAPILLARY: 186 mg/dL — AB (ref 70–99)
GLUCOSE-CAPILLARY: 145 mg/dL — AB (ref 70–99)
Glucose-Capillary: 139 mg/dL — ABNORMAL HIGH (ref 70–99)
Glucose-Capillary: 106 mg/dL — ABNORMAL HIGH (ref 70–99)

## 2014-01-14 LAB — I-STAT CG4 LACTIC ACID, ED: Lactic Acid, Venous: 1.01 mmol/L (ref 0.5–2.2)

## 2014-01-14 LAB — I-STAT ARTERIAL BLOOD GAS, ED
Acid-Base Excess: 4 mmol/L — ABNORMAL HIGH (ref 0.0–2.0)
BICARBONATE: 29.8 meq/L — AB (ref 20.0–24.0)
O2 Saturation: 100 %
PO2 ART: 491 mmHg — AB (ref 80.0–100.0)
Patient temperature: 97.9
TCO2: 31 mmol/L (ref 0–100)
pCO2 arterial: 45.8 mmHg — ABNORMAL HIGH (ref 35.0–45.0)
pH, Arterial: 7.42 (ref 7.350–7.450)

## 2014-01-14 LAB — CBC
HEMATOCRIT: 38.6 % — AB (ref 39.0–52.0)
Hemoglobin: 12.4 g/dL — ABNORMAL LOW (ref 13.0–17.0)
MCH: 29.5 pg (ref 26.0–34.0)
MCHC: 32.1 g/dL (ref 30.0–36.0)
MCV: 91.7 fL (ref 78.0–100.0)
Platelets: 164 10*3/uL (ref 150–400)
RBC: 4.21 MIL/uL — AB (ref 4.22–5.81)
RDW: 15.6 % — AB (ref 11.5–15.5)
WBC: 14 10*3/uL — AB (ref 4.0–10.5)

## 2014-01-14 LAB — URINALYSIS, ROUTINE W REFLEX MICROSCOPIC
BILIRUBIN URINE: NEGATIVE
Glucose, UA: NEGATIVE mg/dL
KETONES UR: NEGATIVE mg/dL
LEUKOCYTES UA: NEGATIVE
Nitrite: NEGATIVE
PH: 5 (ref 5.0–8.0)
Protein, ur: 100 mg/dL — AB
Specific Gravity, Urine: 1.033 — ABNORMAL HIGH (ref 1.005–1.030)
Urobilinogen, UA: 0.2 mg/dL (ref 0.0–1.0)

## 2014-01-14 LAB — MRSA PCR SCREENING: MRSA by PCR: NEGATIVE

## 2014-01-14 LAB — TRIGLYCERIDES: TRIGLYCERIDES: 89 mg/dL (ref <150)

## 2014-01-14 LAB — TROPONIN I: TROPONIN I: 0.9 ng/mL — AB (ref <0.30)

## 2014-01-14 MED ORDER — VANCOMYCIN HCL 500 MG IV SOLR
500.0000 mg | Freq: Once | INTRAVENOUS | Status: AC
  Administered 2014-01-14: 500 mg via INTRAVENOUS
  Filled 2014-01-14 (×2): qty 500

## 2014-01-14 MED ORDER — PROPOFOL 10 MG/ML IV EMUL
5.0000 ug/kg/min | Freq: Once | INTRAVENOUS | Status: AC
  Administered 2014-01-14: 10 ug/kg/min via INTRAVENOUS
  Administered 2014-01-14: 20 ug/kg/min via INTRAVENOUS
  Filled 2014-01-14: qty 100

## 2014-01-14 MED ORDER — PIPERACILLIN-TAZOBACTAM 3.375 G IVPB
3.3750 g | Freq: Three times a day (TID) | INTRAVENOUS | Status: DC
Start: 2014-01-14 — End: 2014-01-14
  Administered 2014-01-14: 3.375 g via INTRAVENOUS
  Filled 2014-01-14 (×5): qty 50

## 2014-01-14 MED ORDER — IPRATROPIUM-ALBUTEROL 0.5-2.5 (3) MG/3ML IN SOLN
3.0000 mL | RESPIRATORY_TRACT | Status: DC
  Administered 2014-01-14 (×5): 3 mL via RESPIRATORY_TRACT
  Filled 2014-01-14 (×6): qty 3

## 2014-01-14 MED ORDER — METHYLPREDNISOLONE SODIUM SUCC 125 MG IJ SOLR
60.0000 mg | Freq: Two times a day (BID) | INTRAMUSCULAR | Status: DC
  Administered 2014-01-14: 60 mg via INTRAVENOUS
  Filled 2014-01-14 (×3): qty 0.96

## 2014-01-14 MED ORDER — MIDAZOLAM HCL 2 MG/2ML IJ SOLN
4.0000 mg | Freq: Once | INTRAMUSCULAR | Status: AC
  Administered 2014-01-14: 4 mg via INTRAVENOUS

## 2014-01-14 MED ORDER — SUCCINYLCHOLINE CHLORIDE 20 MG/ML IJ SOLN
INTRAMUSCULAR | Status: AC
  Filled 2014-01-14: qty 1

## 2014-01-14 MED ORDER — SODIUM CHLORIDE 0.9 % IV BOLUS (SEPSIS)
500.0000 mL | Freq: Once | INTRAVENOUS | Status: AC
  Administered 2014-01-14: 500 mL via INTRAVENOUS

## 2014-01-14 MED ORDER — INSULIN ASPART 100 UNIT/ML ~~LOC~~ SOLN
2.0000 [IU] | SUBCUTANEOUS | Status: DC
Start: 2014-01-14 — End: 2014-01-14
  Administered 2014-01-14 (×2): 2 [IU] via SUBCUTANEOUS
  Administered 2014-01-14: 4 [IU] via SUBCUTANEOUS

## 2014-01-14 MED ORDER — SODIUM CHLORIDE 0.9 % IV SOLN: INTRAVENOUS | Status: DC

## 2014-01-14 MED ORDER — CHLORHEXIDINE GLUCONATE 0.12 % MT SOLN
15.0000 mL | Freq: Two times a day (BID) | OROMUCOSAL | Status: DC
  Administered 2014-01-14: 15 mL via OROMUCOSAL
  Filled 2014-01-14: qty 15

## 2014-01-14 MED ORDER — FENTANYL CITRATE 0.05 MG/ML IJ SOLN
50.0000 ug | INTRAMUSCULAR | Status: DC | PRN
  Administered 2014-01-14: 50 ug via INTRAVENOUS
  Filled 2014-01-14: qty 2

## 2014-01-14 MED ORDER — MIDAZOLAM HCL 2 MG/2ML IJ SOLN
INTRAMUSCULAR | Status: AC
  Filled 2014-01-14: qty 2

## 2014-01-14 MED ORDER — MORPHINE BOLUS VIA INFUSION
5.0000 mg | INTRAVENOUS | Status: DC | PRN
  Filled 2014-01-14: qty 20

## 2014-01-14 MED ORDER — BIOTENE DRY MOUTH MT LIQD: 15.0000 mL | Freq: Four times a day (QID) | OROMUCOSAL | Status: DC

## 2014-01-14 MED ORDER — ETOMIDATE 2 MG/ML IV SOLN
20.0000 mg | Freq: Once | INTRAVENOUS | Status: AC
  Administered 2014-01-14: 20 mg via INTRAVENOUS

## 2014-01-14 MED ORDER — ETOMIDATE 2 MG/ML IV SOLN
INTRAVENOUS | Status: AC
  Filled 2014-01-14: qty 20

## 2014-01-14 MED ORDER — MORPHINE SULFATE 10 MG/ML IJ SOLN
1.0000 mg/h | INTRAVENOUS | Status: DC
  Administered 2014-01-14: 10 mg/h via INTRAVENOUS
  Filled 2014-01-14: qty 10

## 2014-01-14 MED ORDER — SODIUM CHLORIDE 0.9 % IV SOLN: 250.0000 mL | INTRAVENOUS | Status: DC | PRN

## 2014-01-14 MED ORDER — PROPOFOL 10 MG/ML IV EMUL
0.0000 ug/kg/min | INTRAVENOUS | Status: DC
  Administered 2014-01-14: 25 ug/kg/min via INTRAVENOUS
  Administered 2014-01-14: 45 ug/kg/min via INTRAVENOUS
  Filled 2014-01-14: qty 100

## 2014-01-14 MED ORDER — NOREPINEPHRINE BITARTRATE 1 MG/ML IJ SOLN
2.0000 ug/min | INTRAVENOUS | Status: DC
  Administered 2014-01-14: 5 ug/min via INTRAVENOUS
  Filled 2014-01-14: qty 8

## 2014-01-14 MED ORDER — ROCURONIUM BROMIDE 50 MG/5ML IV SOLN
50.0000 mg | Freq: Once | INTRAVENOUS | Status: AC
  Administered 2014-01-14: 50 mg via INTRAVENOUS

## 2014-01-14 MED ORDER — PIPERACILLIN-TAZOBACTAM 3.375 G IVPB 30 MIN
3.3750 g | Freq: Once | INTRAVENOUS | Status: AC
  Administered 2014-01-14: 3.375 g via INTRAVENOUS
  Filled 2014-01-14: qty 50

## 2014-01-14 MED ORDER — SUCCINYLCHOLINE CHLORIDE 20 MG/ML IJ SOLN
50.0000 mg | Freq: Once | INTRAMUSCULAR | Status: DC
  Filled 2014-01-14: qty 2.5

## 2014-01-14 MED ORDER — SODIUM CHLORIDE 0.9 % IV SOLN
10.0000 ug/h | INTRAVENOUS | Status: DC
  Administered 2014-01-14: 50 ug/h via INTRAVENOUS
  Filled 2014-01-14: qty 50

## 2014-01-14 MED ORDER — ROCURONIUM BROMIDE 50 MG/5ML IV SOLN
INTRAVENOUS | Status: AC
  Filled 2014-01-14: qty 2

## 2014-01-14 MED ORDER — PANTOPRAZOLE SODIUM 40 MG IV SOLR
40.0000 mg | Freq: Every day | INTRAVENOUS | Status: DC
  Filled 2014-01-14: qty 40

## 2014-01-14 MED ORDER — VANCOMYCIN HCL 500 MG IV SOLR
500.0000 mg | Freq: Two times a day (BID) | INTRAVENOUS | Status: DC
  Administered 2014-01-14: 500 mg via INTRAVENOUS
  Filled 2014-01-14: qty 500

## 2014-01-14 MED ORDER — LIDOCAINE HCL (CARDIAC) 20 MG/ML IV SOLN
INTRAVENOUS | Status: AC
  Filled 2014-01-14: qty 5

## 2014-01-14 MED ORDER — HEPARIN SODIUM (PORCINE) 5000 UNIT/ML IJ SOLN
5000.0000 [IU] | Freq: Three times a day (TID) | INTRAMUSCULAR | Status: DC
  Administered 2014-01-14: 5000 [IU] via SUBCUTANEOUS
  Filled 2014-01-14 (×4): qty 1
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
## 2014-01-14 NOTE — Progress Notes (Signed)
Wasted remaining 250ml of fentanyl out of 250 ml IV bag.  Witnessed by Felipa Evener, RN on 2MW.

## 2014-01-14 NOTE — Procedures (Signed)
Extubation Procedure Note  Patient Details:   Name: [name] DOB: 10/24/1936 MRN: [id number]   Airway Documentation:     Evaluation  O2 sats: stable throughout Complications: No apparent complications Patient did tolerate procedure well. Bilateral Breath Sounds: Clear Suctioning: Airway Yes Patient tolerated withdrawal of care to comfort care. Extubated to room air with family at bedside. Patient is resting comfortably and vocalizing.   Myrtie Neither 02-06-14, 3:05 PM
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
## 2014-01-14 NOTE — Procedures (Addendum)
Central Venous Catheter Insertion Procedure Note [name] [id number] 07-Feb-1936  Procedure: Insertion of Central Venous Catheter Indications: Assessment of intravascular volume, Drug and/or fluid administration and Frequent blood sampling  Procedure Details Consent: Risks of procedure as well as the alternatives and risks of each were explained to the (patient/caregiver).  Consent for procedure obtained. Time Out: Verified patient identification, verified procedure, site/side was marked, verified correct patient position, special equipment/implants available, medications/allergies/relevent history reviewed, required imaging and test results available.  Performed  Maximum sterile technique was used including antiseptics, cap, gloves, gown, hand hygiene, mask and sheet. Skin prep: Chlorhexidine; local anesthetic administered A antimicrobial bonded/coated triple lumen catheter was placed in the right internal jugular vein using the Seldinger technique. Procedure done under direct visualization with ultrasound.  Evaluation Blood flow good Complications: No apparent complications Patient did tolerate procedure well. Chest X-ray ordered to verify placement.  CXR: pending.  [name] 12/23/2013, 4:22 AM

## 2014-01-14 NOTE — Progress Notes (Signed)
Chaplain paged to provide support to patient's family. Patient's son and grandson were in and out of room at bedside. Chaplain offered ministry of presence and emotional support.   01/11/2014 0036  Clinical Encounter Type  Visited With Family  Visit Type Initial;ED  Referral From Nurse  Spiritual Encounters  Spiritual Needs Emotional  Stress Factors  Family Stress Factors Health changes

## 2014-01-14 NOTE — Progress Notes (Deleted)
78yo male was in respiratory distress at home, EMS gave albuterol and Atrovent w/ O2 sat improvement, initially refused to come to ED and family talked him into coming, became unresponsive en route, now intubated and concern for aspiration, to begin IV ABX.  Will start Zosyn 3.375g IV Q8H for CrCl ~60 ml/min and monitor CBC, Cx.  Wynona Neat, PharmD, BCPS 12/20/2013 1:31 AM
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
## 2014-01-14 NOTE — ED Notes (Signed)
Propofol rate changed to 15 mcg.

## 2014-01-14 NOTE — ED Notes (Signed)
According to Son, "he wanted [place] to do everything we could to save pts. Life."  Pt. Has said in the past, "just want to come home and die."  Pt. Has no record of an DNR order.

## 2014-01-14 NOTE — ED Notes (Signed)
Family at bedside. Demonstrate understanding of plan of care. Concerned about outcome of family member.
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
## 2014-01-14 NOTE — Progress Notes (Signed)
ANTIBIOTIC CONSULT NOTE - INITIAL  Pharmacy Consult for Vancocin and Zosyn Indication: rule out pneumonia  No Known Allergies  Patient Measurements: Height: 5' 10.08" (178 cm) Weight: 117 lb 11.6 oz (53.4 kg) IBW/kg (Calculated) : 73.18  Vital Signs: Temp: 97 F (36.1 C) (02/28 0150) Temp src: Rectal (02/28 0150) BP: 119/88 mmHg (02/28 0145) Pulse Rate: 86 (02/28 0145)  Labs:  Recent Labs  Jan 23, 2014 0054  WBC 15.8*  HGB 13.3  PLT 195  CREATININE 1.06   Estimated Creatinine Clearance: 45.5 ml/min (by C-G formula based on Cr of 1.06).   Microbiology: Recent Results (from the past 720 hour(s))  MRSA PCR SCREENING     Status: None   Collection Time    12/27/13  9:10 AM      Result Value Ref Range Status   MRSA by PCR NEGATIVE  NEGATIVE Final   Comment:            The GeneXpert MRSA Assay (FDA     approved for NASAL specimens     only), is one component of a     comprehensive MRSA colonization     surveillance program. It is not     intended to diagnose MRSA     infection nor to guide or     monitor treatment for     MRSA infections.  CULTURE, EXPECTORATED SPUTUM-ASSESSMENT     Status: None   Collection Time    12/27/13  8:40 PM      Result Value Ref Range Status   Specimen Description SPUTUM   Final   Special Requests NONE   Final   Sputum evaluation     Final   Value: THIS SPECIMEN IS ACCEPTABLE. RESPIRATORY CULTURE REPORT TO FOLLOW.   Report Status 12/27/2013 FINAL   Final  CULTURE, RESPIRATORY (NON-EXPECTORATED)     Status: None   Collection Time    12/27/13  8:40 PM      Result Value Ref Range Status   Specimen Description SPUTUM   Final   Special Requests NONE   Final   Gram Stain     Final   Value: MODERATE WBC PRESENT,BOTH PMN AND MONONUCLEAR     FEW SQUAMOUS EPITHELIAL CELLS PRESENT     MODERATE GRAM POSITIVE COCCI     IN PAIRS IN CHAINS FEW GRAM NEGATIVE RODS     Performed at [facility]   Culture     Final   Value: NORMAL  OROPHARYNGEAL FLORA     Performed at [facility]   Report Status 12/30/2013 FINAL   Final    Medical History: Past Medical History  Diagnosis Date  . Colon cancer   . COPD (chronic obstructive pulmonary disease)     Assessment: 78yo male was in respiratory distress at home, EMS gave albuterol and Atrovent w/ O2 sat improvement, initially refused to come to ED and family talked him into coming, became unresponsive en route, now intubated and concern for aspiration, to begin IV ABX.  Goal of Therapy:  Vancomycin trough level 15-20 mcg/ml  Plan:  Will start vancomycin 500mg  IV Q12H and Zosyn 3.375g IV Q8H and monitor CBC, Cx, levels prn.  Wynona Neat, PharmD, BCPS  Jan 23, 2014,1:56 AM

## 2014-01-15 DEATH — deceased

## 2014-01-16 NOTE — Progress Notes (Signed)
UR Completed.  [name] [id number] 01/16/2014

## 2014-01-23 NOTE — Discharge Summary (Signed)
    DEATH SUMMARY  Name: [name] MRN: [id number] DOB: Nov 16, 1936    ADMISSION DATE:  02/12/14  PRIMARY SERVICE: PCCM  CHIEF COMPLAINT:  Acute respiratory      HISTORY OF PRESENT ILLNESS:   78 years old male with PMH relevant for COPD and colon cancer (unknown stage), not on any treatment. Recently in the hospital (2/10) with respiratory distress and hypoxemia (likely COPD related). He left AMA because he wanted to die at home. Today he was brought by his family again, unresponsive, hypoxemic an unable to protect his airway. As per the family request he was intubated. As per the son Peter New ), today he expressed that he wanted to de and that he was ready and he did not want any aggressive interventions including intubation. The son said "I am not ready to make that decision" and requested the patient to be intubated.  At the time of examination, the patient is intubated, unresponsive, hypertensive, not breathing above the vent. As per the ED physician, during intubation, she visualized a large vocal cord mass. As er previous notes, there was some evidence of aspiration at home.   SIGNIFICANT EVENTS / STUDIES:  Chest X ray: - ET tube in adequate position - Hyperinflated lungs - Questionable LLL parenchymal infiltrates.    LINES / TUBES: - Peripheral IV's  CULTURES: - Blood cultures sent - Will send respiratory cultures.   ANTIBIOTICS: - Zosyn  CXR:  - ET tube in adequate position - Hyperinflated lungs - Questionable LLL parenchymal infiltrates.  ASSESSMENT / PLAN:  PULMONARY A: 1) Acute hypoxemic respiratory failure 2) Recent hospitalization 3) We will treat as HCAP. No clear infiltrate on chest X ray. Possibly on LLL 4) Vocal cord mass P:   - Mechanical ventilation - Duonebs q4 hrs - Zosyn / vanc  INFECTIOUS A:   1) Possible HCAP vs COPD exacerbation. Recent hospitalization. We will treat as HCAP P:   - Zosyn - Vancomycin   ENDOCRINE A:   1)  Hyperglycemia P:   - Novolog sliding scale   NEUROLOGIC A:   1) Intubated, unresponsive. P:   - Sedation with Propofol  COURSE - Extensive discussion with family- son & daughter present among others. Based on pts known wishes , agreed for withdrawal of life support & progress to full comfort   Cause of Death - Acute respiratory failure, COPD exacerbation, HCAP, metastatic colon cancer  Kara Mead MD. FCCP. [facility] Pager [phone]   01/23/2014, 1:42 AM

## 2014-02-15 NOTE — Progress Notes (Signed)
Patient was pronounce death at 00.03. Asystole on screen, no pulse , no heart beat on auscultation, no breathing. Family and MD notified

## 2014-02-15 DEATH — deceased
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
